# Patient Record
Sex: Female | Born: 1980 | Hispanic: Yes | Marital: Single | Smoking: Never smoker
Health system: Southern US, Community
[De-identification: ages and names within clinical notes are randomized; demographics above are authoritative.]

---

## 2005-07-09 ENCOUNTER — Observation Stay: Payer: Self-pay | Admitting: Obstetrics and Gynecology

## 2007-10-19 ENCOUNTER — Encounter: Payer: Self-pay | Admitting: Maternal & Fetal Medicine

## 2007-11-30 ENCOUNTER — Encounter: Payer: Self-pay | Admitting: Maternal & Fetal Medicine

## 2007-12-28 ENCOUNTER — Encounter: Payer: Self-pay | Admitting: Maternal & Fetal Medicine

## 2008-02-08 ENCOUNTER — Encounter: Payer: Self-pay | Admitting: Maternal & Fetal Medicine

## 2008-02-15 ENCOUNTER — Encounter: Payer: Self-pay | Admitting: Maternal & Fetal Medicine

## 2008-02-22 ENCOUNTER — Encounter: Payer: Self-pay | Admitting: Obstetrics and Gynecology

## 2008-03-04 ENCOUNTER — Encounter: Payer: Self-pay | Admitting: Obstetrics & Gynecology

## 2008-03-07 ENCOUNTER — Encounter: Payer: Self-pay | Admitting: Maternal & Fetal Medicine

## 2008-11-24 IMAGING — US ULTRAOUND OB LIMITED - NRPT [facility]
1 series · 14 of 15 positions shown · non-contrast
Comparison: none

[Series 1: ultraound ob limited - nrpt [facility] · 0.33mm/px · 14 of 15 slices shown]
[im 1/15]
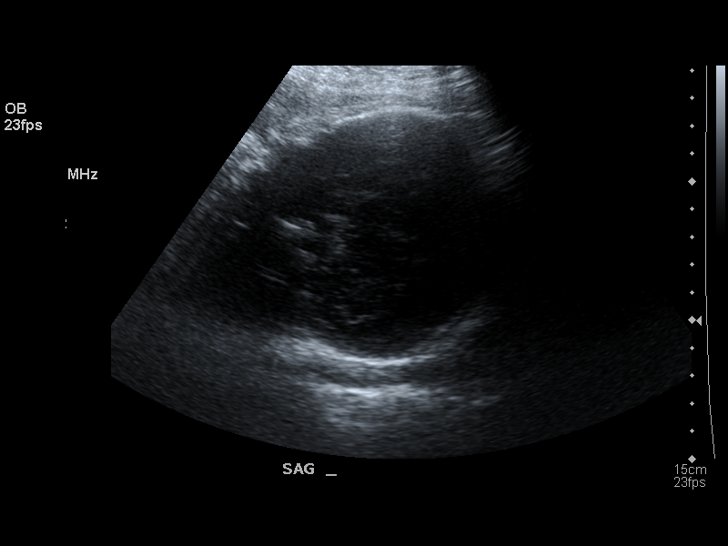
[im 2/15]
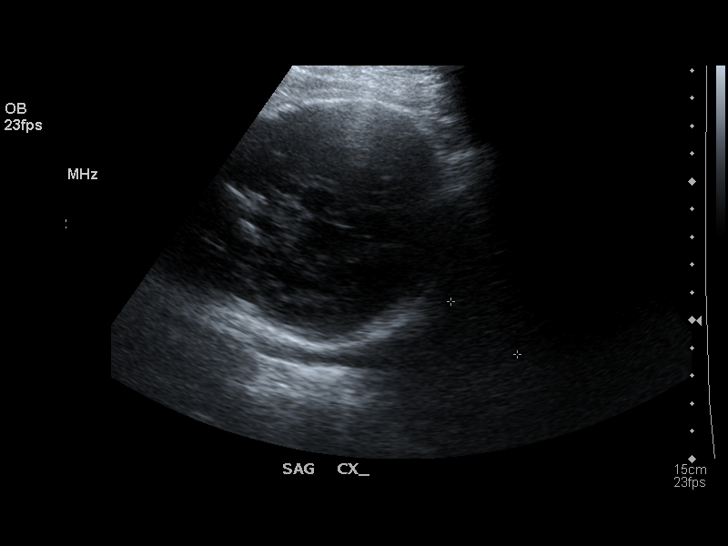
[im 3/15]
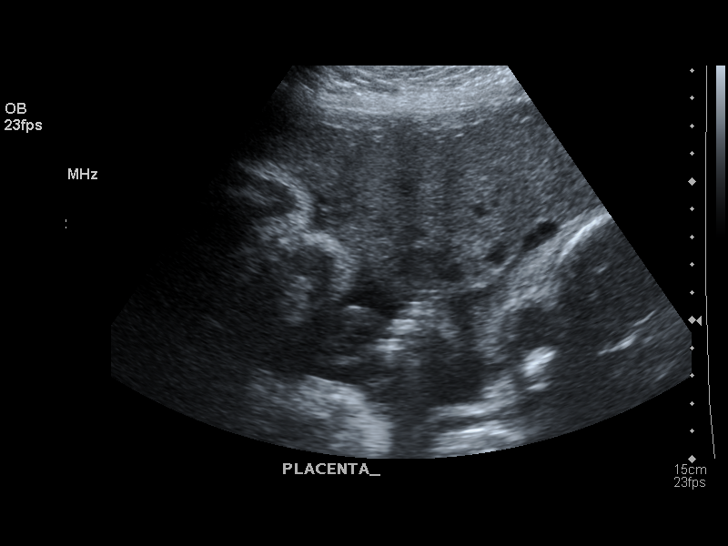
[im 4/15]
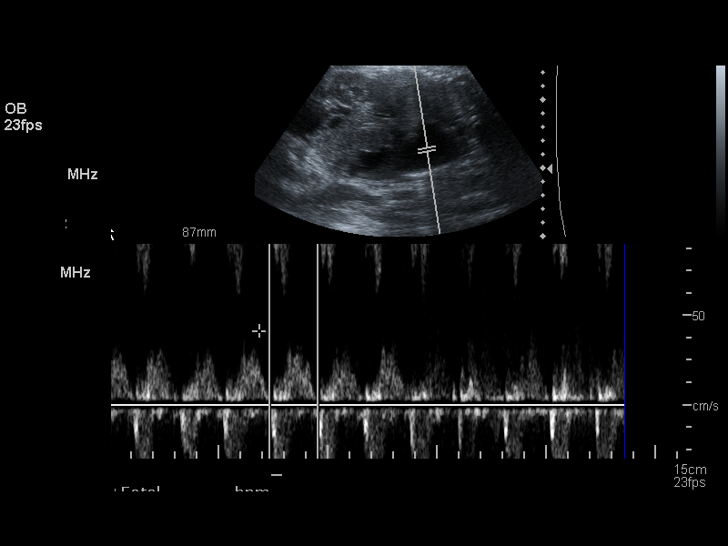
[im 5/15]
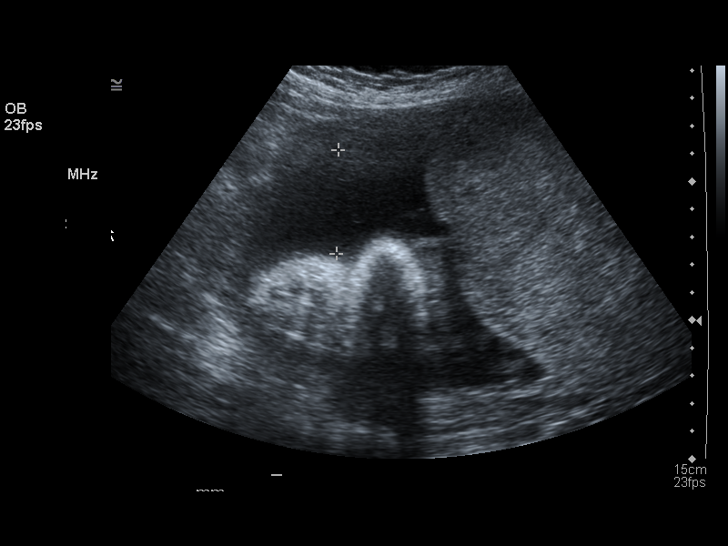
[im 6/15]
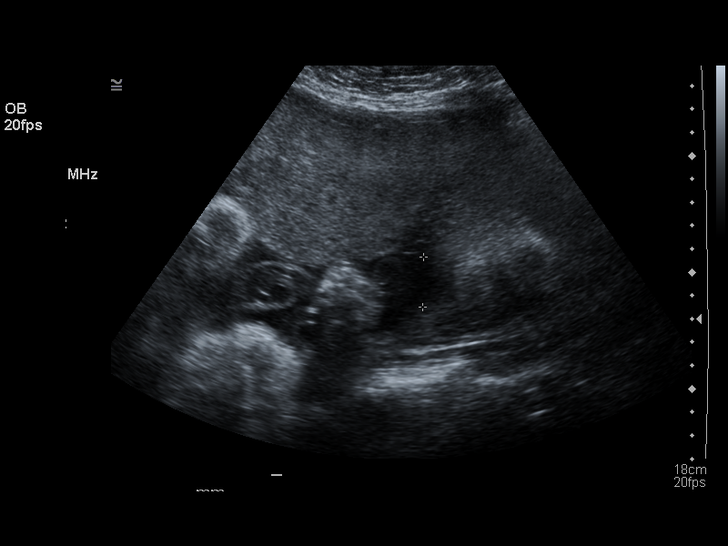
[im 7/15]
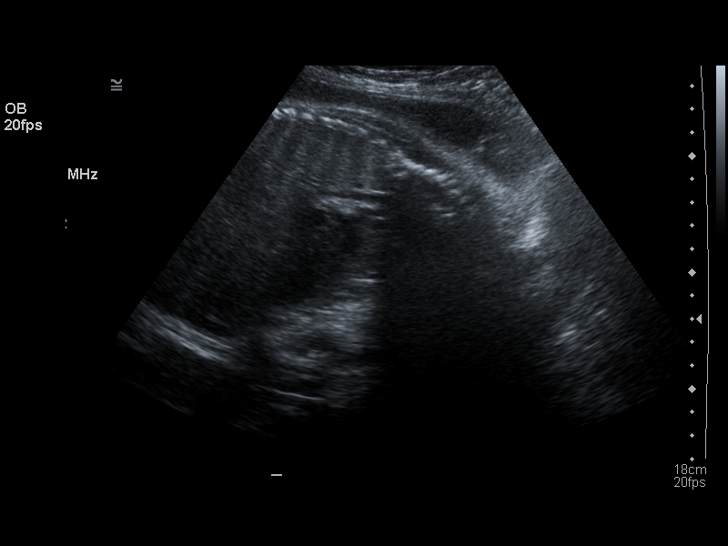
[im 9/15]
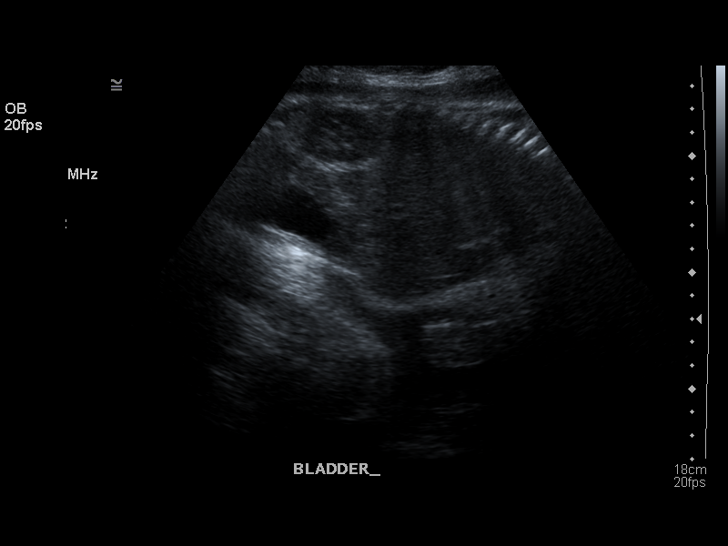
[im 10/15]
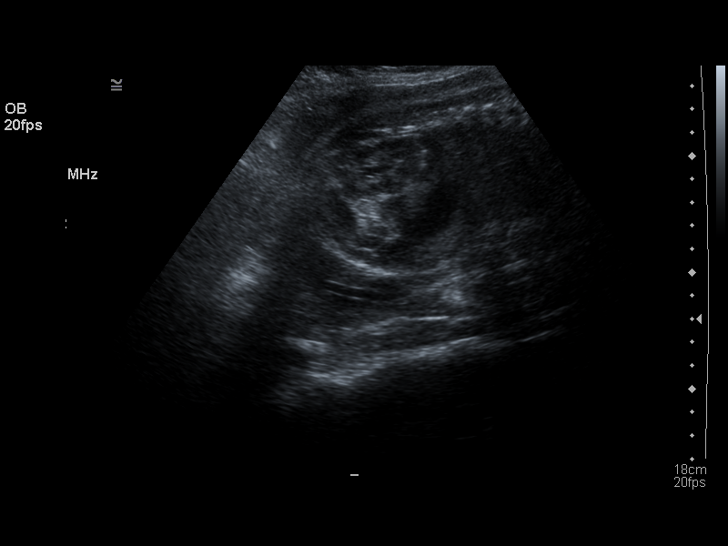
[im 11/15]
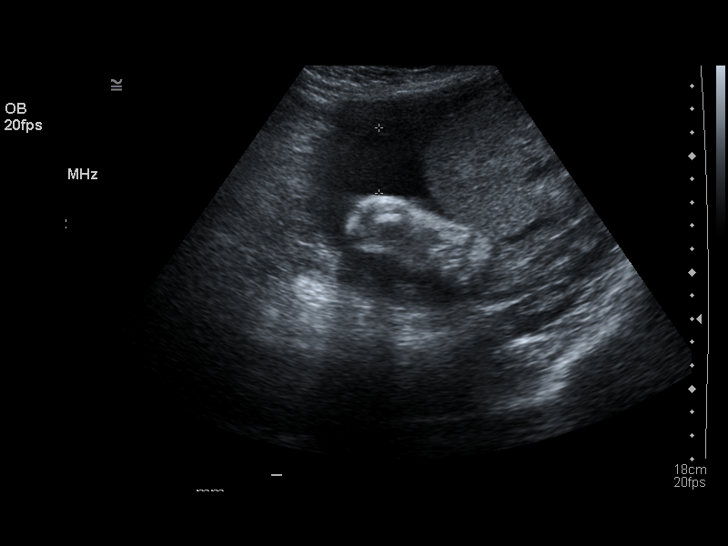
[im 12/15]
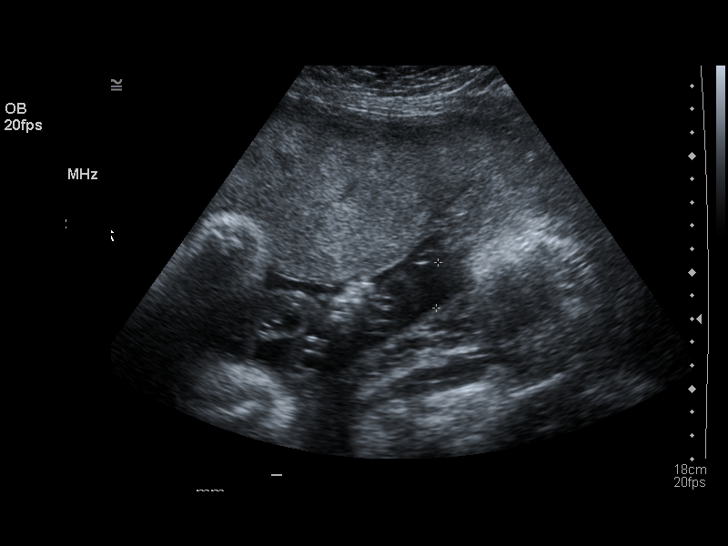
[im 13/15]
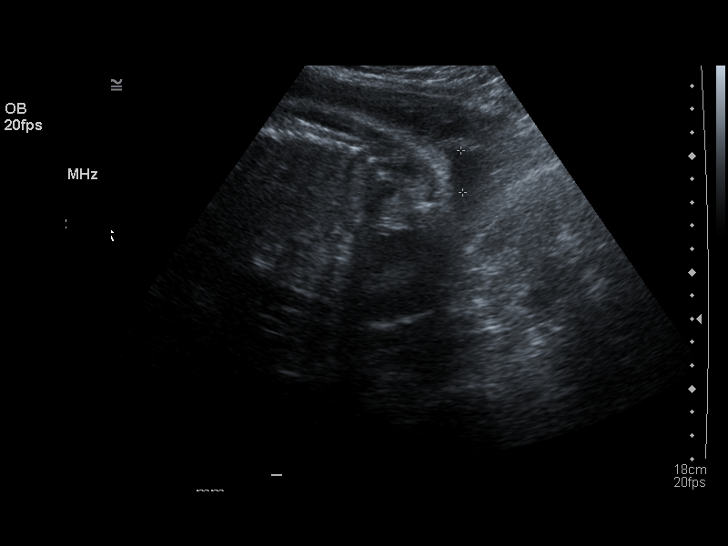
[im 14/15]
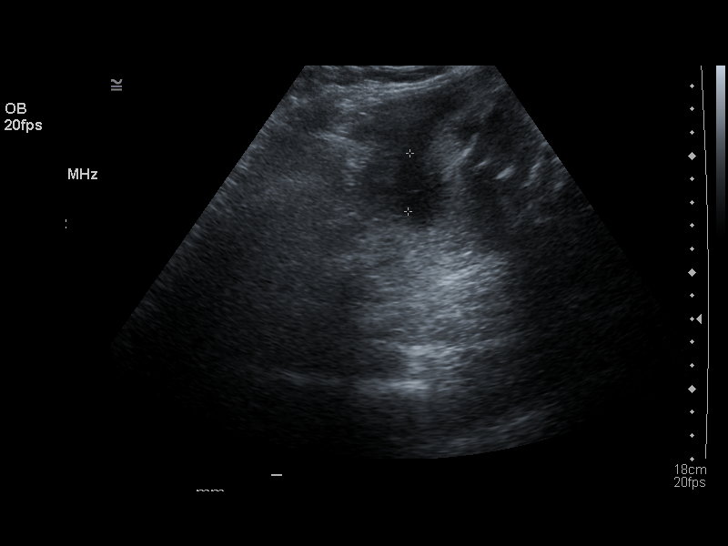
[im 15/15]
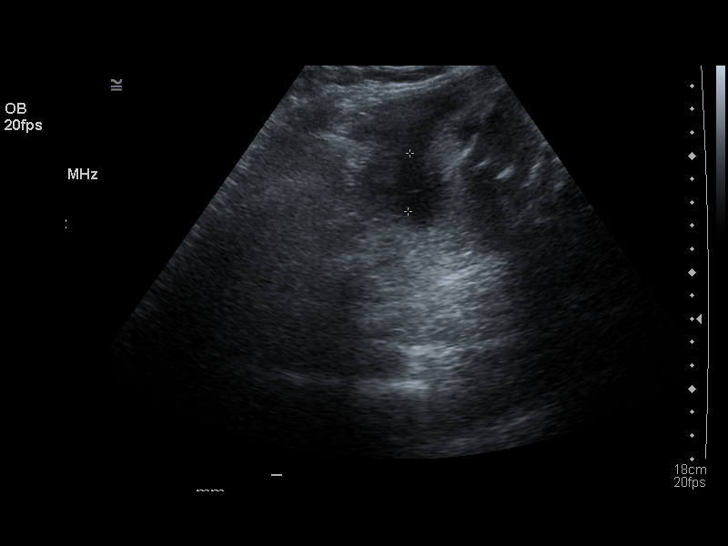

[14 of 15 positions shown; findings below may reference images not displayed]

IMAGES IMPORTED FROM THE [employer] WORKFLOW SYSTEM
NO DICTATION FOR STUDY

## 2008-12-01 IMAGING — US ULTRAOUND OB LIMITED - [facility]PT [facility]
1 series · 11 of 11 positions shown · non-contrast
Comparison: none

[Series 1: ultraound ob limited - nrpt [facility] · 0.33mm/px · 11 of 11 slices shown]
[im 1/11]
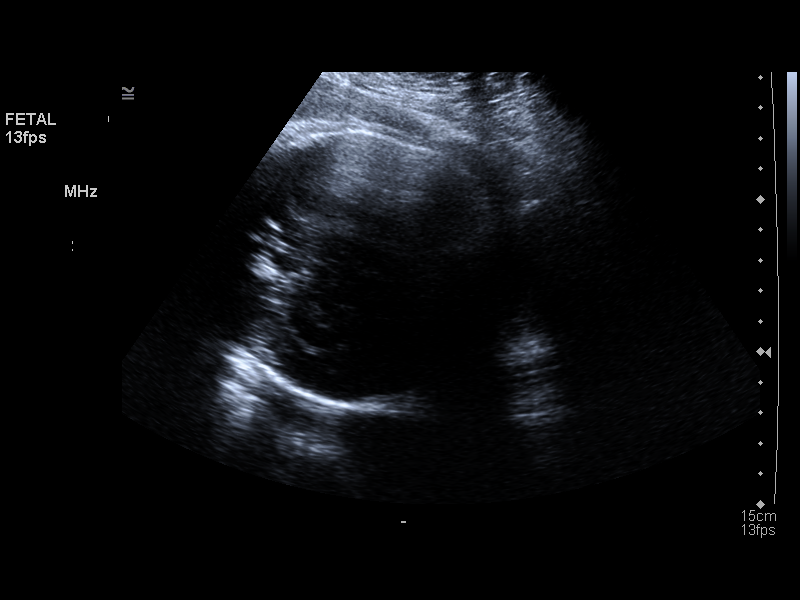
[im 2/11]
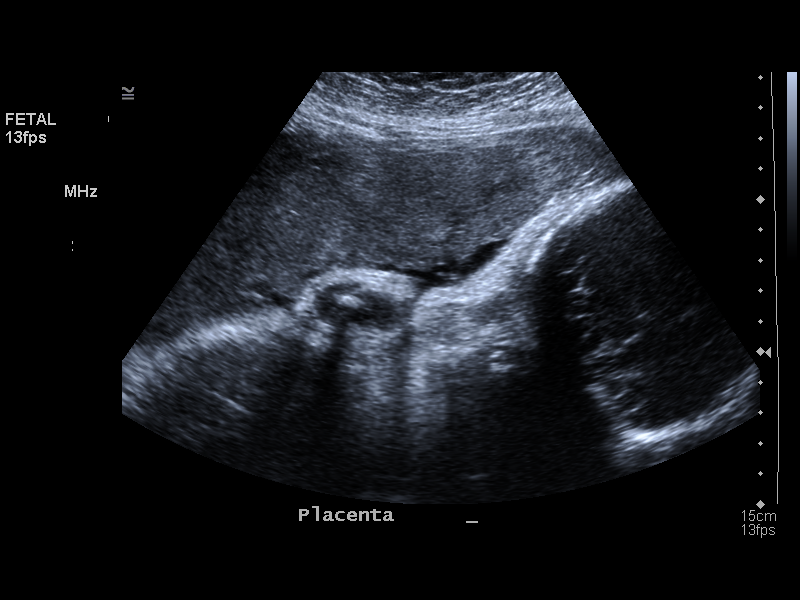
[im 3/11]
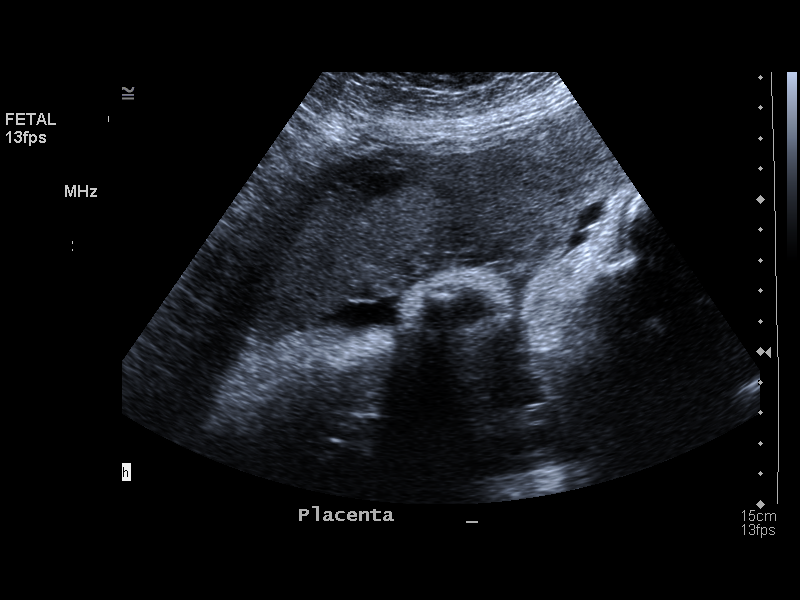
[im 4/11]
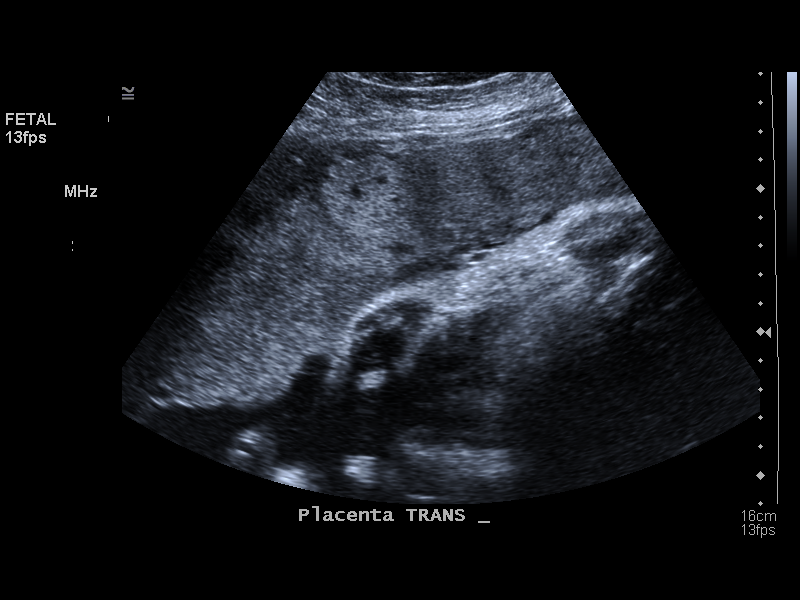
[im 5/11]
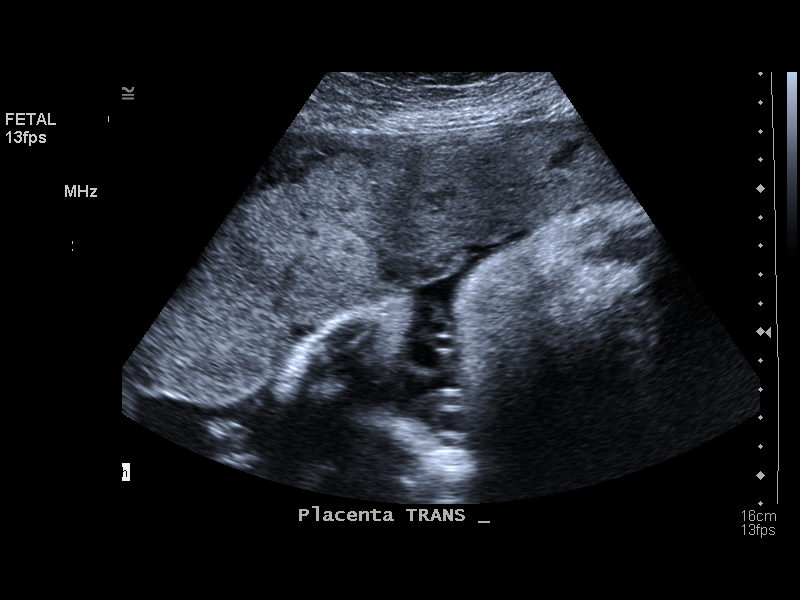
[im 6/11]
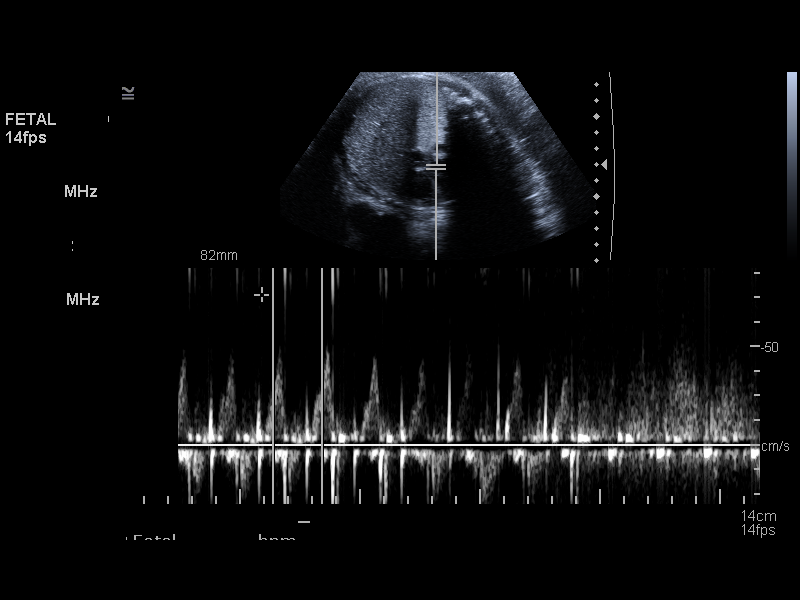
[im 7/11]
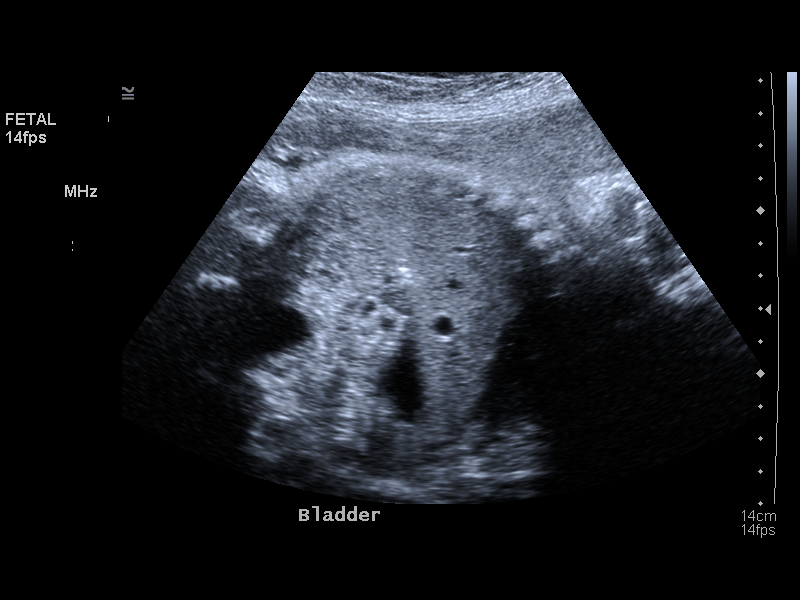
[im 8/11]
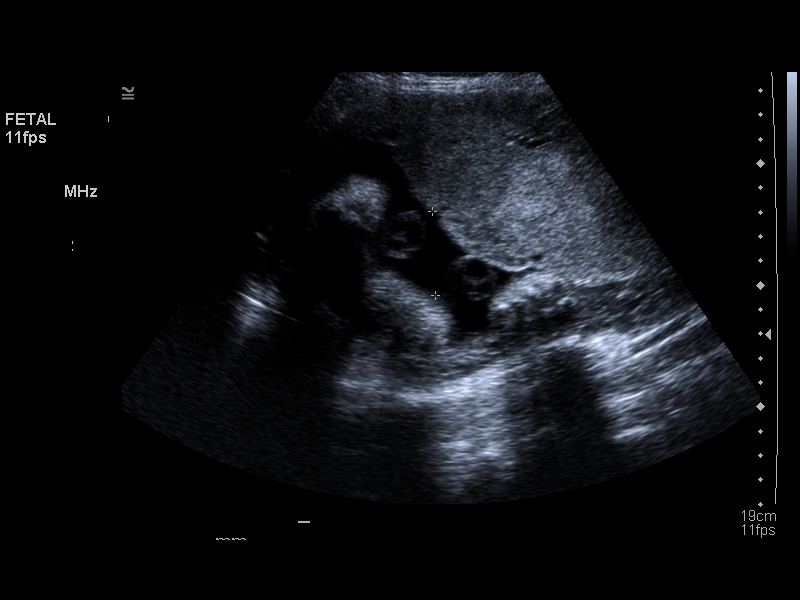
[im 9/11]
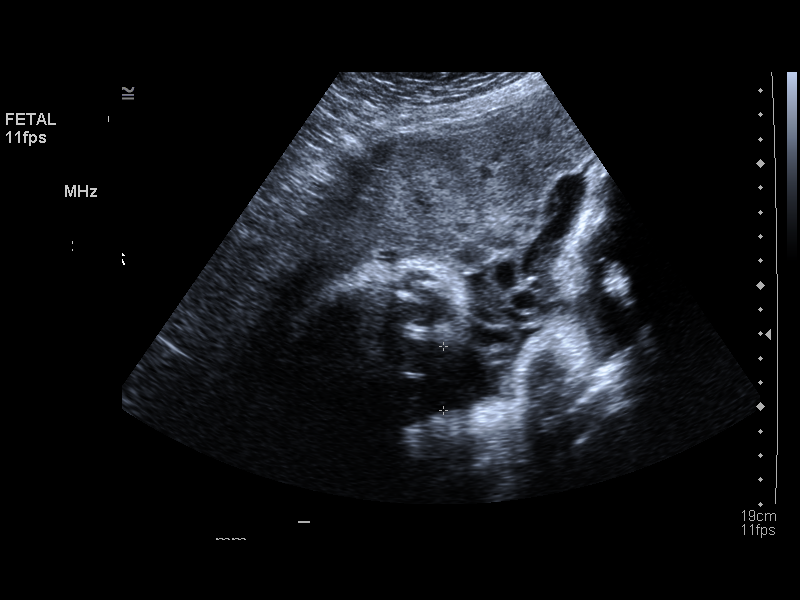
[im 10/11]
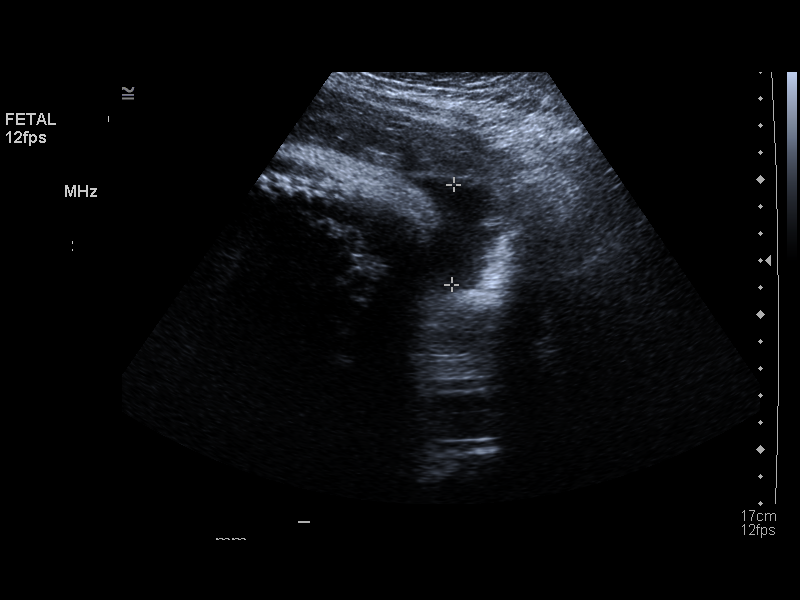
[im 11/11]
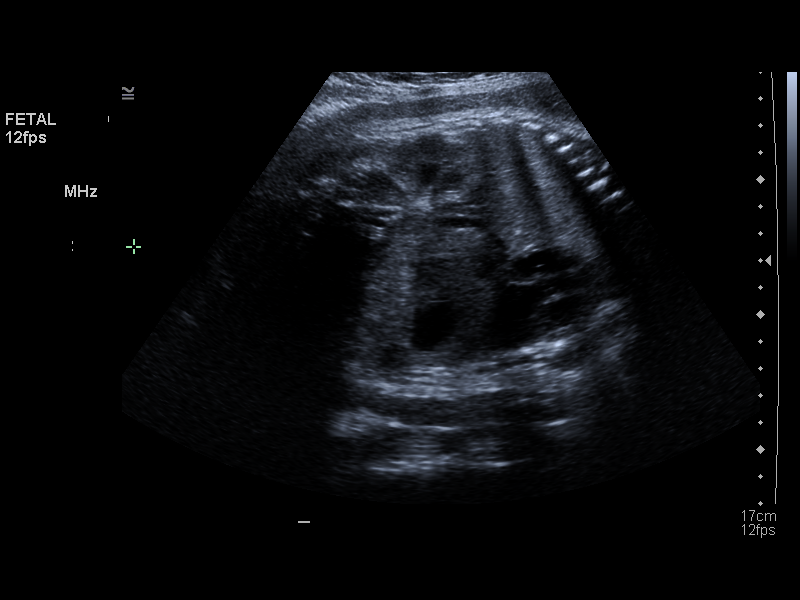

[11 of 11 positions shown; findings below may reference images not displayed]

IMAGES IMPORTED FROM THE [employer] WORKFLOW SYSTEM
NO DICTATION FOR STUDY

## 2012-06-02 ENCOUNTER — Ambulatory Visit: Payer: Self-pay | Admitting: Family Medicine

## 2013-01-04 ENCOUNTER — Observation Stay: Payer: Self-pay | Admitting: Obstetrics and Gynecology

## 2013-03-12 IMAGING — US US OB < 14 WEEKS
1 series · 14 of 28 positions shown · non-contrast
Comparison: none

REASON FOR EXAM: call report  [id number]  viability
COMMENTS:  Dr did not order transvgainal ultrasound

[Series 1: us ob < 14 weeks · 0.23mm/px · 39 acquisitions, 14 frames shown]
[im 2/39]
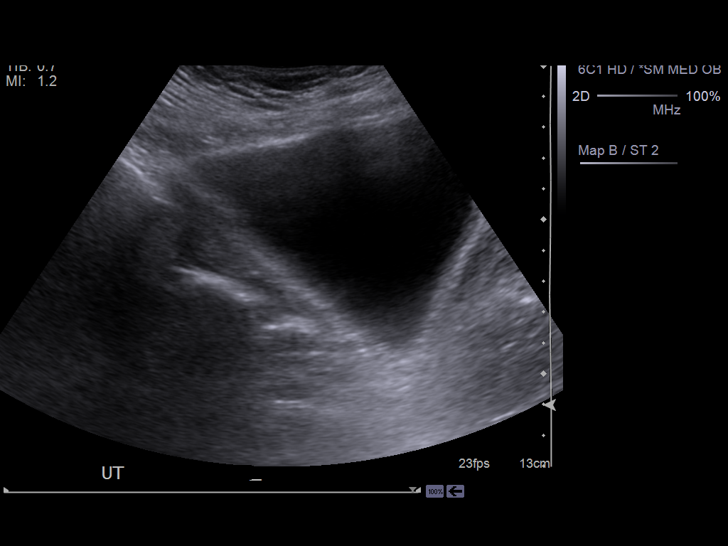
[im 5/39]
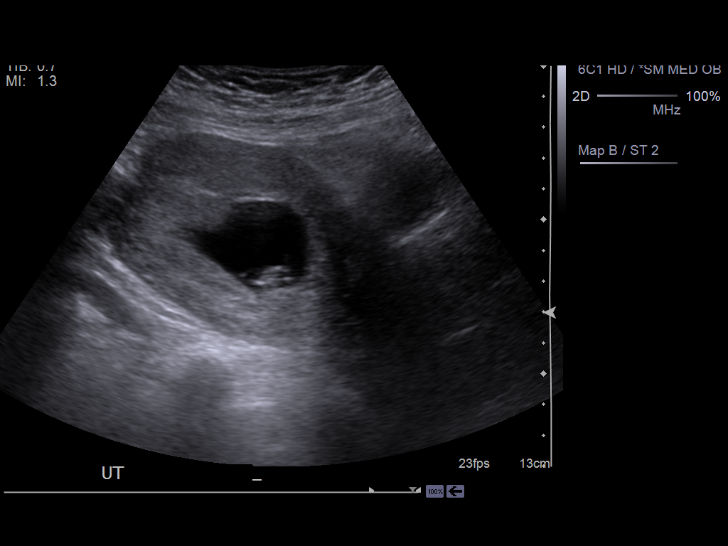
[im 8/39]
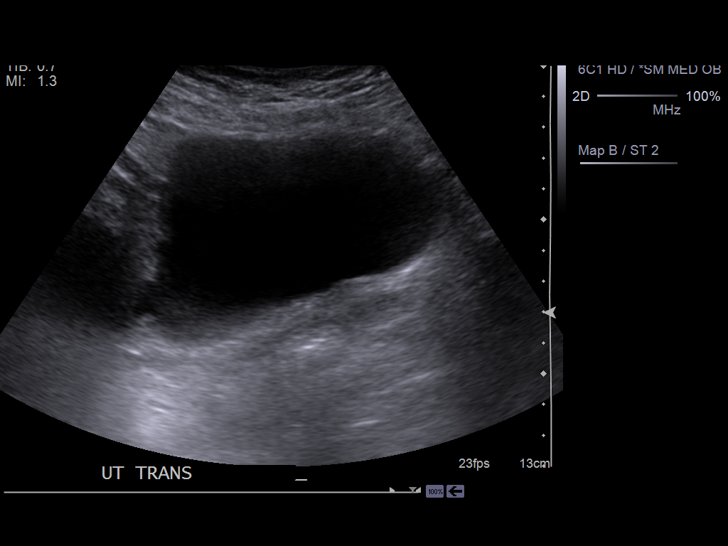
[im 10/39]
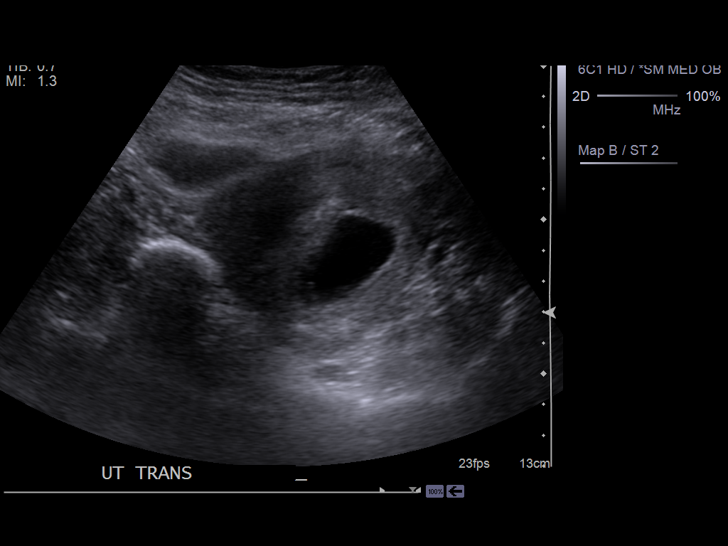
[im 13/39]
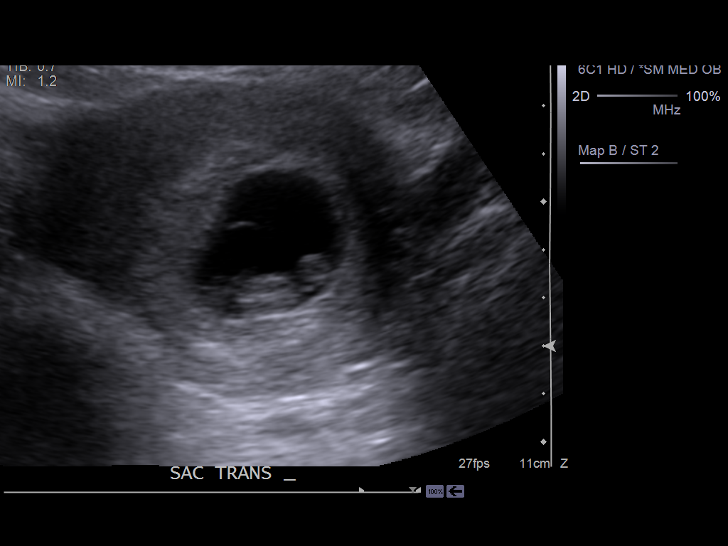
[im 16/39]
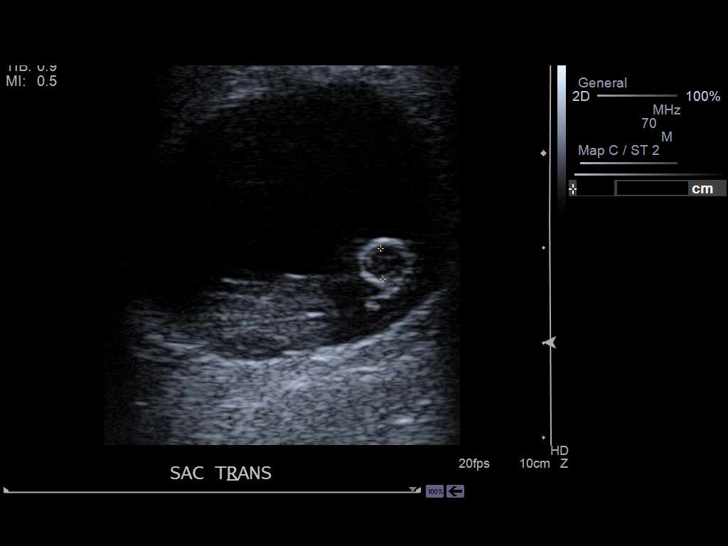
[im 19/39]
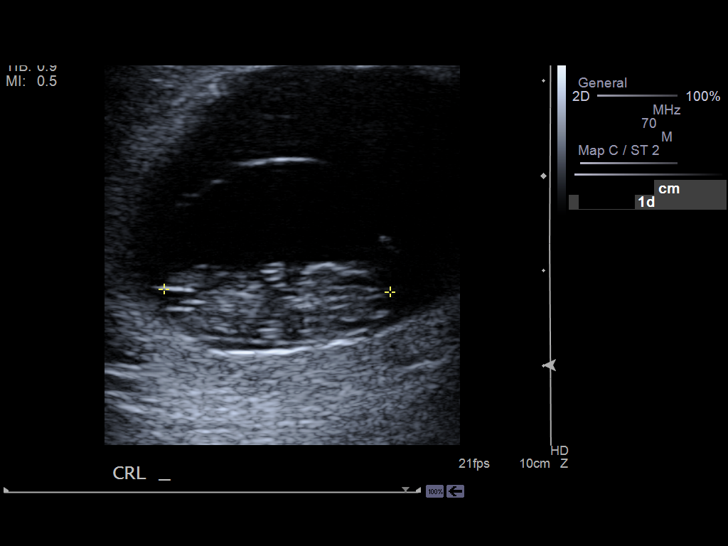
[im 22/39]
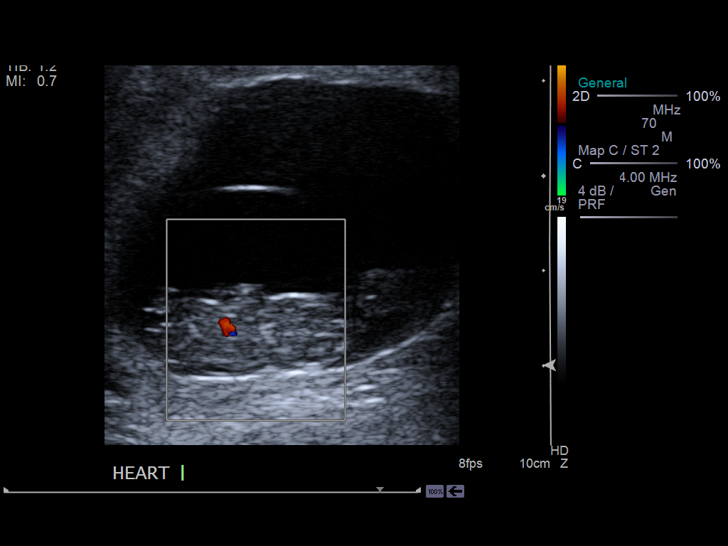
[im 24/39]
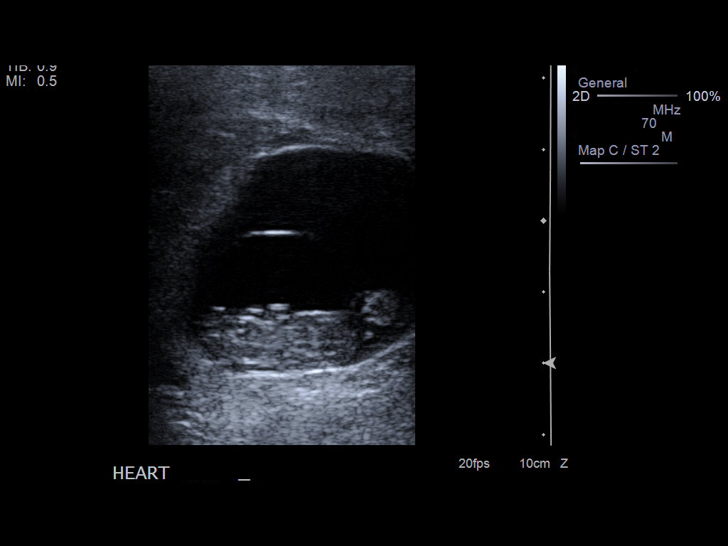
[im 27/39]
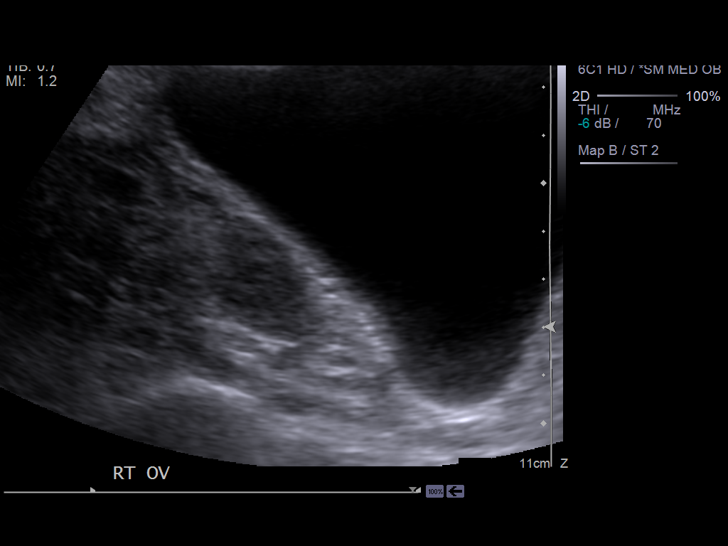
[im 30/39]
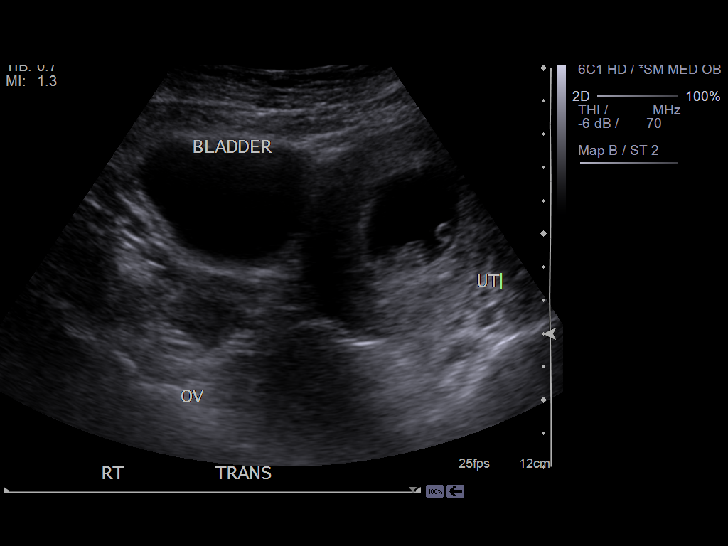
[im 33/39]
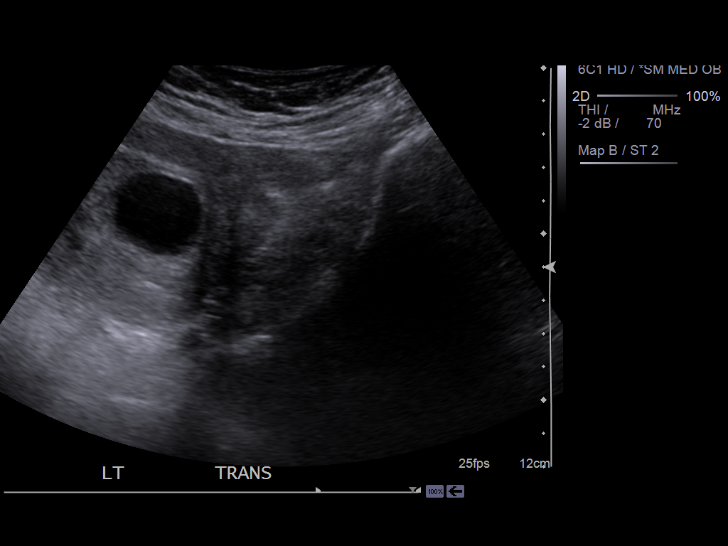
[im 36/39]
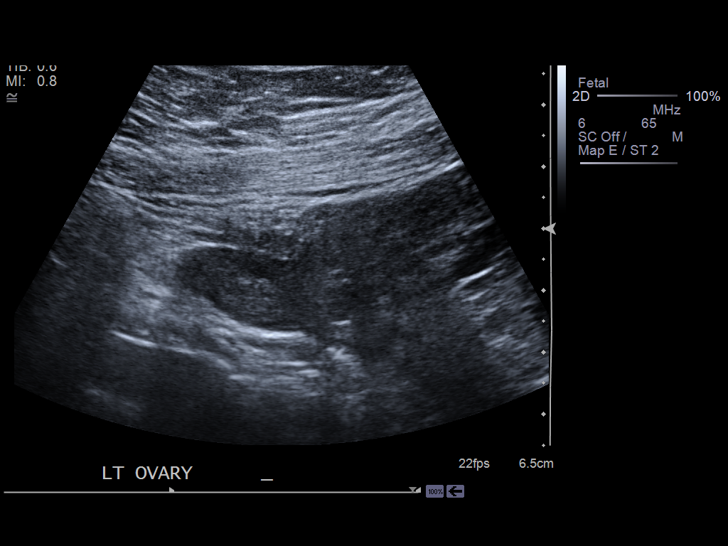
[im 39/39]
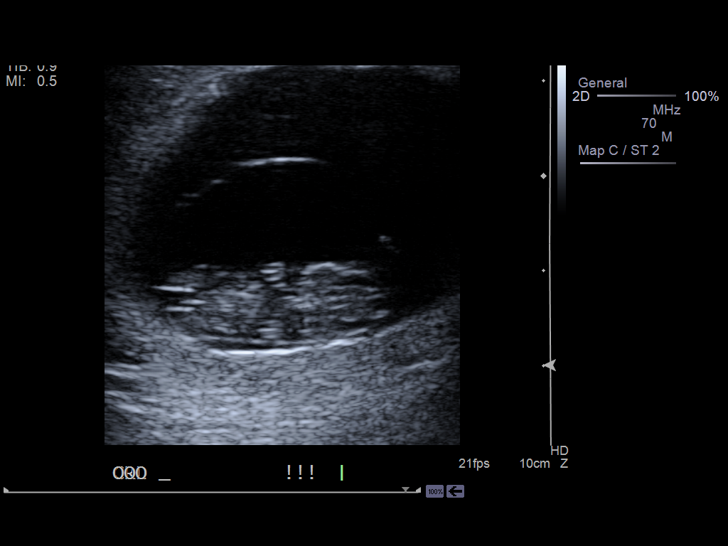

[14 of 28 positions shown; findings below may reference images not displayed]

PROCEDURE:     US  - US OB LESS THAN 14 WEEKS  - June 02, 2012 [DATE]

RESULT:     There is a gravid uterus. The crown-rump length measures an
average of 2.37 cm corresponding to a 9 week 1 day gestation. A yolk sac is
demonstrated. A fetal cardiac rate of 169 beats per minute is demonstrated.
The maternal right ovary measures 2.9 x 2 x 2.7 cm. The left ovary measures
2.5 x 1.4 x 1.3 cm.
IMPRESSION: There is an early IUP with estimated gestational age of 9
weeks 1 day + / - 6 days with estimated date of confinement January 04, 2013. No maternal adnexal abnormalities are demonstrated.

[REDACTED]

## 2019-09-11 ENCOUNTER — Ambulatory Visit: Payer: Self-pay | Attending: Internal Medicine

## 2019-09-11 DIAGNOSIS — Z23 Encounter for immunization: Secondary | ICD-10-CM

## 2019-09-11 NOTE — Progress Notes (Signed)
   Covid-19 Vaccination Clinic  Name:  Elissia Spiewak    MRN: [id number] DOB: 01-Apr-1981  09/11/2019  Ms. Klosinski was observed post Covid-19 immunization for 15 minutes without incident. She was provided with Vaccine Information Sheet and instruction to access the V-Safe system.   Ms. Torelli was instructed to call 911 with any severe reactions post vaccine: Marland Kitchen Difficulty breathing  . Swelling of face and throat  . A fast heartbeat  . A bad rash all over body  . Dizziness and weakness   Immunizations Administered    Name Date Dose VIS Date Route   Pfizer COVID-19 Vaccine 09/11/2019  3:37 PM 0.3 mL 06/20/2018 Intramuscular   Manufacturer: [employer], [employer]   Lot: [id number]   NDC: [address]-5

## 2019-10-02 ENCOUNTER — Ambulatory Visit: Payer: Self-pay | Attending: Internal Medicine

## 2019-10-02 ENCOUNTER — Ambulatory Visit: Payer: Self-pay

## 2019-10-02 DIAGNOSIS — Z23 Encounter for immunization: Secondary | ICD-10-CM

## 2019-10-02 NOTE — Progress Notes (Signed)
   Covid-19 Vaccination Clinic  Name:  Jayden Rudge    MRN: [id number] DOB: 02-01-1981  10/02/2019  Ms. Tedder was observed post Covid-19 immunization for 15 minutes without incident. She was provided with Vaccine Information Sheet and instruction to access the V-Safe system.   Ms. Broshears was instructed to call 911 with any severe reactions post vaccine: Marland Kitchen Difficulty breathing  . Swelling of face and throat  . A fast heartbeat  . A bad rash all over body  . Dizziness and weakness   Immunizations Administered    Name Date Dose VIS Date Route   Pfizer COVID-19 Vaccine 10/02/2019  3:12 PM 0.3 mL 06/20/2018 Intramuscular   Manufacturer: [employer], [employer]   Lot: [id number]   NDC: [address]-9

## 2020-05-12 ENCOUNTER — Ambulatory Visit: Payer: Self-pay

## 2022-03-12 ENCOUNTER — Encounter: Payer: Self-pay | Admitting: Internal Medicine

## 2022-04-15 ENCOUNTER — Other Ambulatory Visit: Payer: Self-pay

## 2022-04-15 DIAGNOSIS — Z1231 Encounter for screening mammogram for malignant neoplasm of breast: Secondary | ICD-10-CM

## 2022-05-17 ENCOUNTER — Ambulatory Visit: Payer: Self-pay | Attending: Hematology and Oncology | Admitting: Hematology and Oncology

## 2022-05-17 ENCOUNTER — Ambulatory Visit
Admission: RE | Admit: 2022-05-17 | Discharge: 2022-05-17 | Disposition: A | Discharge status: Home / Self Care | Payer: Self-pay | Source: Ambulatory Visit | Attending: Obstetrics and Gynecology | Admitting: Obstetrics and Gynecology

## 2022-05-17 VITALS — BP 115/75 | Wt 150.1 lb

## 2022-05-17 DIAGNOSIS — Z1231 Encounter for screening mammogram for malignant neoplasm of breast: Secondary | ICD-10-CM

## 2022-05-17 NOTE — Progress Notes (Signed)
Ms. Emily Gibson is a 42 y.o. female who presents to [facility] clinic today with complaint of.    Pap Smear: Pap not smear completed today. Last Pap smear was 03/2022 at [facility] clinic and was normal. Per patient has no history of an abnormal Pap smear. Last Pap smear result is not available in Epic.   Physical exam: Breasts Breasts symmetrical. No skin abnormalities bilateral breasts. No nipple retraction bilateral breasts. No nipple discharge bilateral breasts. No lymphadenopathy. No lumps palpated bilateral breasts.       Pelvic/Bimanual Pap is not indicated today    Smoking History: Patient has never smoked and was not referred to quit line.    Patient Navigation: Patient education provided. Access to services provided for patient through [place] interpreter provided. No transportation provided   Colorectal Cancer Screening: Per patient has never had colonoscopy completed No complaints today.    Breast and Cervical Cancer Risk Assessment: Patient does not have family history of breast cancer, known genetic mutations, or radiation treatment to the chest before age 34. Patient does not have history of cervical dysplasia, immunocompromised, or DES exposure in-utero.  Risk Scores as of 05/17/2022     Emily Gibson           5-year 0.51 %   Lifetime 7.14 %   This patient is [place]/Latina but has no documented birth country, so the [facility] used data from [place] patients to calculate their risk score. Document a birth country in the Demographics activity for a more accurate score.         Last calculated by Demetrius Revel, LPN on 12/22/9369 at 69:67 AM        A: [facility] exam without pap smear No complaint with benign exam.   P: Referred patient to [facility] for a screening mammogram. Appointment scheduled 05/17/22.  Melodye Ped, NP 05/17/2022 10:52 AM

## 2022-05-17 NOTE — Patient Instructions (Signed)
Taught [name] Comment about BSE and gave educational materials to take home. Patient did not need a Pap smear today due to last Pap smear was in 2023 per patient. Let her know [facility] will cover Pap smears every 5 years unless has a history of abnormal Pap smears. [facility] Appointment scheduled for 05/17/22. Patient aware of appointment and will be there. Let patient know will follow up with her within the next couple weeks with results. Emily Gibson verbalized understanding.  Melodye Ped, NP 10:54 AM

## 2022-05-18 ENCOUNTER — Other Ambulatory Visit: Payer: Self-pay | Admitting: Obstetrics and Gynecology

## 2022-05-18 DIAGNOSIS — N6489 Other specified disorders of breast: Secondary | ICD-10-CM

## 2022-05-18 DIAGNOSIS — R928 Other abnormal and inconclusive findings on diagnostic imaging of breast: Secondary | ICD-10-CM

## 2022-05-25 ENCOUNTER — Ambulatory Visit
Admission: RE | Admit: 2022-05-25 | Discharge: 2022-05-25 | Disposition: A | Discharge status: Home / Self Care | Payer: Self-pay | Source: Ambulatory Visit | Attending: Obstetrics and Gynecology | Admitting: Obstetrics and Gynecology

## 2022-05-25 DIAGNOSIS — N6489 Other specified disorders of breast: Secondary | ICD-10-CM

## 2022-05-25 DIAGNOSIS — R928 Other abnormal and inconclusive findings on diagnostic imaging of breast: Secondary | ICD-10-CM

## 2023-05-18 ENCOUNTER — Telehealth: Payer: Self-pay | Admitting: *Deleted
